# Patient Record
Sex: Male | Born: 1975 | Race: White | Hispanic: No | Marital: Single | Smoking: Never smoker
Health system: Southern US, Community
[De-identification: ages and names within clinical notes are randomized; demographics above are authoritative.]

## PROBLEM LIST (undated history)

## (undated) DIAGNOSIS — I10 Essential (primary) hypertension: Secondary | ICD-10-CM

---

## 2017-11-26 ENCOUNTER — Emergency Department
Admission: EM | Admit: 2017-11-26 | Discharge: 2017-11-26 | Disposition: A | Discharge status: Home / Self Care | Payer: BLUE CROSS/BLUE SHIELD | Attending: Emergency Medicine | Admitting: Emergency Medicine

## 2017-11-26 ENCOUNTER — Other Ambulatory Visit: Payer: Self-pay

## 2017-11-26 DIAGNOSIS — R002 Palpitations: Secondary | ICD-10-CM | POA: Diagnosis not present

## 2017-11-26 DIAGNOSIS — Z79899 Other long term (current) drug therapy: Secondary | ICD-10-CM | POA: Insufficient documentation

## 2017-11-26 LAB — COMPREHENSIVE METABOLIC PANEL
ALBUMIN: 4.7 g/dL (ref 3.5–5.0)
ALK PHOS: 63 U/L (ref 38–126)
ALT: 27 U/L (ref 17–63)
ANION GAP: 9 (ref 5–15)
AST: 30 U/L (ref 15–41)
BILIRUBIN TOTAL: 1.1 mg/dL (ref 0.3–1.2)
BUN: 14 mg/dL (ref 6–20)
CALCIUM: 9.5 mg/dL (ref 8.9–10.3)
CO2: 25 mmol/L (ref 22–32)
Chloride: 103 mmol/L (ref 101–111)
Creatinine, Ser: 1.22 mg/dL (ref 0.61–1.24)
GFR calc Af Amer: >60 mL/min (ref >60)
GFR calc non Af Amer: >60 mL/min (ref >60)
GLUCOSE: 125 mg/dL — AB (ref 65–99)
Potassium: 3.5 mmol/L (ref 3.5–5.1)
SODIUM: 137 mmol/L (ref 135–145)
Total Protein: 8.1 g/dL (ref 6.5–8.1)

## 2017-11-26 LAB — CBC
HEMATOCRIT: 46 % (ref 40.0–52.0)
HEMOGLOBIN: 15.8 g/dL (ref 13.0–18.0)
MCH: 31.6 pg (ref 26.0–34.0)
MCHC: 34.4 g/dL (ref 32.0–36.0)
MCV: 91.9 fL (ref 80.0–100.0)
Platelets: 299 10*3/uL (ref 150–440)
RBC: 5 MIL/uL (ref 4.40–5.90)
RDW: 12.5 % (ref 11.5–14.5)
WBC: 6.1 10*3/uL (ref 3.8–10.6)

## 2017-11-26 LAB — TROPONIN I: Troponin I: <0.03 ng/mL (ref <0.03)

## 2017-11-26 NOTE — ED Notes (Signed)
Pt alert and oriented X4, active, cooperative, pt in NAD. RR even and unlabored, color WNL.  Pt informed to return if any life threatening symptoms occur.  Discharge and followup instructions reviewed.  

## 2017-11-26 NOTE — ED Triage Notes (Signed)
Feeling of palpitations began 7 am. Denies chest pain. History of same.

## 2017-11-26 NOTE — ED Provider Notes (Signed)
[facility] Emergency Department Provider Note  ____________________________________________   First MD Initiated Contact with Patient 11/26/17 [phone]     (approximate)  I have reviewed the triage vital signs and the nursing notes.   HISTORY  Chief Complaint Palpitations   HPI Tyrone Reyes is a 42 y.o. male with a history of heart palpitations on metoprolol 50 mg twice daily who is presenting to the emergency department today complaining of feeling his heart racing since about 7:15 AM.  He says that it is associated with lightheadedness and radiation of pain down his bilateral arms.  He says that he has had similar symptoms in the past and is seen a cardiologist, Dr. Harl Bowie.  He says that he has had a stress test as well as an echocardiogram but was not able to get the Holter monitor testing.  He says that he has similar symptoms every few weeks but they have been prolonged this morning which is why he presented to the emergency department for further evaluation.  He denies any history of heart disease in his family.  Denies any smoking.  Drinks occasionally and does not use any drugs.  No past medical history on file.  There are no active problems to display for this patient.     Prior to Admission medications   Medication Sig Start Date End Date Taking? Authorizing Provider  metoprolol tartrate (LOPRESSOR) 50 MG tablet Take 50 mg by mouth 2 (two) times daily. 10/29/17  Yes [provider]    Allergies Patient has no known allergies.  No family history on file.  Social History Social History   Tobacco Use  . Smoking status: Not on file  Substance Use Topics  . Alcohol use: Not on file  . Drug use: Not on file    Review of Systems  Constitutional: No fever/chills Eyes: No visual changes. ENT: No sore throat. Cardiovascular: As above Respiratory: Denies shortness of breath. Gastrointestinal: No abdominal pain.  No nausea, no  vomiting.  No diarrhea.  No constipation. Genitourinary: Negative for dysuria. Musculoskeletal: Negative for back pain. Skin: Negative for rash. Neurological: Negative for headaches, focal weakness or numbness.   ____________________________________________   PHYSICAL EXAM:  VITAL SIGNS: ED Triage Vitals  Enc Vitals Group     BP 11/26/17 0854 116/78     Pulse Rate 11/26/17 0854 76     Resp 11/26/17 0854 20     Temp 11/26/17 0854 98 F (36.7 C)     Temp Source 11/26/17 0854 Oral     SpO2 11/26/17 0854 97 %     Weight 11/26/17 0855 220 lb (99.8 kg)     Height 11/26/17 0855  (1.753 m)     Head Circumference --      Peak Flow --      Pain Score 11/26/17 0855 0     Pain Loc --      Pain Edu? --      Excl. in [place]? --     Constitutional: Alert and oriented. Well appearing and in no acute distress. Eyes: Conjunctivae are normal.  Head: Atraumatic. Nose: No congestion/rhinnorhea. Mouth/Throat: Mucous membranes are moist.  Neck: No stridor.   Cardiovascular: Normal rate, regular rhythm. Grossly normal heart sounds.  Good peripheral circulation with equal and bilateral radial pulses. Respiratory: Normal respiratory effort.  No retractions. Lungs CTAB. Gastrointestinal: Soft and nontender. No distention.  Musculoskeletal: No lower extremity tenderness nor edema.  No joint effusions. Neurologic:  Normal speech and  language. No gross focal neurologic deficits are appreciated. Skin:  Skin is warm, dry and intact. No rash noted. Psychiatric: Mood and affect are normal. Speech and behavior are normal.  ____________________________________________   LABS (all labs ordered are listed, but only abnormal results are displayed)  Labs Reviewed  COMPREHENSIVE METABOLIC PANEL - Abnormal; Notable for the following components:      Result Value   Glucose, Bld 125 (*)    All other components within normal limits  CBC  TROPONIN I    ____________________________________________  EKG  ED ECG REPORT I, Arelia Longest, the attending physician, personally viewed and interpreted this ECG.   Date: 11/26/2017  EKG Time: 0859  Rate: 80  Rhythm: normal sinus rhythm  Axis: Normal  Intervals:none  ST&T Change: No ST segment elevation or depression.  No abnormal T wave inversion.  ____________________________________________  RADIOLOGY   ____________________________________________   PROCEDURES  Procedure(s) performed:   Procedures  Critical Care performed:   ____________________________________________   INITIAL IMPRESSION / ASSESSMENT AND PLAN / ED COURSE  Pertinent labs & imaging results that were available during my care of the patient were reviewed by me and considered in my medical decision making (see chart for details).  DDX: Palpitations, atrial fibrillation, WPW, atrial flutter, ventricular tachycardia, near-syncope As part of my medical decision making, I reviewed the following data within the electronic MEDICAL RECORD NUMBERNo previous records on file for review.  ----------------------------------------- 10:22 AM on 11/26/2017 -----------------------------------------  Patient at this time says he feels improved.  No longer feeling palpitations.  No pain to the arms.  Says that he has had symptoms like this in the past.  Very reassuring work-up here with normal lab testing as well as EKG.  States that he has had provocative testing at Dr. Renie Ora office that was negative.  However did not have a Holter monitor.  Recommend that he call Dr. Harl Bowie for follow-up.  Blood pressure in the 1 teens today.  I will not be increasing his metoprolol.  The patient will continue on metoprolol and follow-up as an outpatient with Dr. Harl Bowie.  He is understanding of this plan willing to comply. ____________________________________________   FINAL CLINICAL IMPRESSION(S) / ED  DIAGNOSES  Palpitations.    NEW MEDICATIONS STARTED DURING THIS VISIT:  New Prescriptions   No medications on file     Note:  This document was prepared using Dragon voice recognition software and may include unintentional dictation errors.     Myrna Blazer, MD 11/26/17 1023

## 2019-05-01 ENCOUNTER — Other Ambulatory Visit: Payer: Self-pay | Admitting: Cardiology

## 2019-05-01 ENCOUNTER — Ambulatory Visit
Admission: RE | Admit: 2019-05-01 | Discharge: 2019-05-01 | Disposition: A | Discharge status: Home / Self Care | Payer: BC Managed Care – PPO | Attending: Internal Medicine | Admitting: Internal Medicine

## 2019-05-01 ENCOUNTER — Other Ambulatory Visit: Payer: Self-pay

## 2019-05-01 ENCOUNTER — Ambulatory Visit
Admission: RE | Admit: 2019-05-01 | Discharge: 2019-05-01 | Disposition: A | Discharge status: Home / Self Care | Payer: BC Managed Care – PPO | Source: Ambulatory Visit | Attending: Cardiology | Admitting: Cardiology

## 2019-05-01 DIAGNOSIS — R202 Paresthesia of skin: Secondary | ICD-10-CM | POA: Insufficient documentation

## 2019-05-01 DIAGNOSIS — R200 Anesthesia of skin: Secondary | ICD-10-CM

## 2019-09-19 ENCOUNTER — Ambulatory Visit: Payer: BC Managed Care – PPO | Attending: Internal Medicine

## 2019-09-19 ENCOUNTER — Other Ambulatory Visit: Payer: Self-pay

## 2019-09-19 DIAGNOSIS — Z23 Encounter for immunization: Secondary | ICD-10-CM

## 2019-09-19 NOTE — Progress Notes (Signed)
   Covid-19 Vaccination Clinic  Name:  Tyrone Reyes    MRN: [id number] DOB: September 29, 1975  09/19/2019  [name] was observed post Covid-19 immunization for 15 minutes without incident. He was provided with Vaccine Information Sheet and instruction to access the V-Safe system.   [name] was instructed to call 911 with any severe reactions post vaccine: Marland Kitchen Difficulty breathing  . Swelling of face and throat  . A fast heartbeat  . A bad rash all over body  . Dizziness and weakness   Immunizations Administered    Name Date Dose VIS Date Route   Pfizer COVID-19 Vaccine 09/19/2019  8:11 AM 0.3 mL 06/20/2019 Intramuscular   Manufacturer: [employer], [employer]   Lot: [id number]   NDC: [address]-5

## 2019-10-14 ENCOUNTER — Ambulatory Visit: Payer: BC Managed Care – PPO | Attending: Internal Medicine

## 2019-10-14 DIAGNOSIS — Z23 Encounter for immunization: Secondary | ICD-10-CM

## 2019-10-14 NOTE — Progress Notes (Signed)
   Covid-19 Vaccination Clinic  Name:  Khasir Woodrome    MRN: [id number] DOB: 1975-09-12  10/14/2019  Mr. Limas was observed post Covid-19 immunization for 15 minutes without incident. He was provided with Vaccine Information Sheet and instruction to access the V-Safe system.   Mr. Burgoon was instructed to call 911 with any severe reactions post vaccine: Marland Kitchen Difficulty breathing  . Swelling of face and throat  . A fast heartbeat  . A bad rash all over body  . Dizziness and weakness   Immunizations Administered    Name Date Dose VIS Date Route   Pfizer COVID-19 Vaccine 10/14/2019  8:28 AM 0.3 mL 06/20/2019 Intramuscular   Manufacturer: [employer], [employer]   Lot: [phone] [id number]   NDC: [address]-1

## 2020-07-16 ENCOUNTER — Other Ambulatory Visit: Payer: Self-pay | Admitting: Internal Medicine

## 2021-04-03 IMAGING — CR DG CERVICAL SPINE COMPLETE 4+V
1 series · 6 of 6 positions shown · non-contrast
Comparison: No prior.

CLINICAL DATA: Bilateral arm and finger numbness.  No known injury.

EXAM:
CERVICAL SPINE - COMPLETE 4+ VIEW

[Series 1: dg cervical spine complete · 0.14mm/px · 6 of 6 slices shown]
[im 1/6]
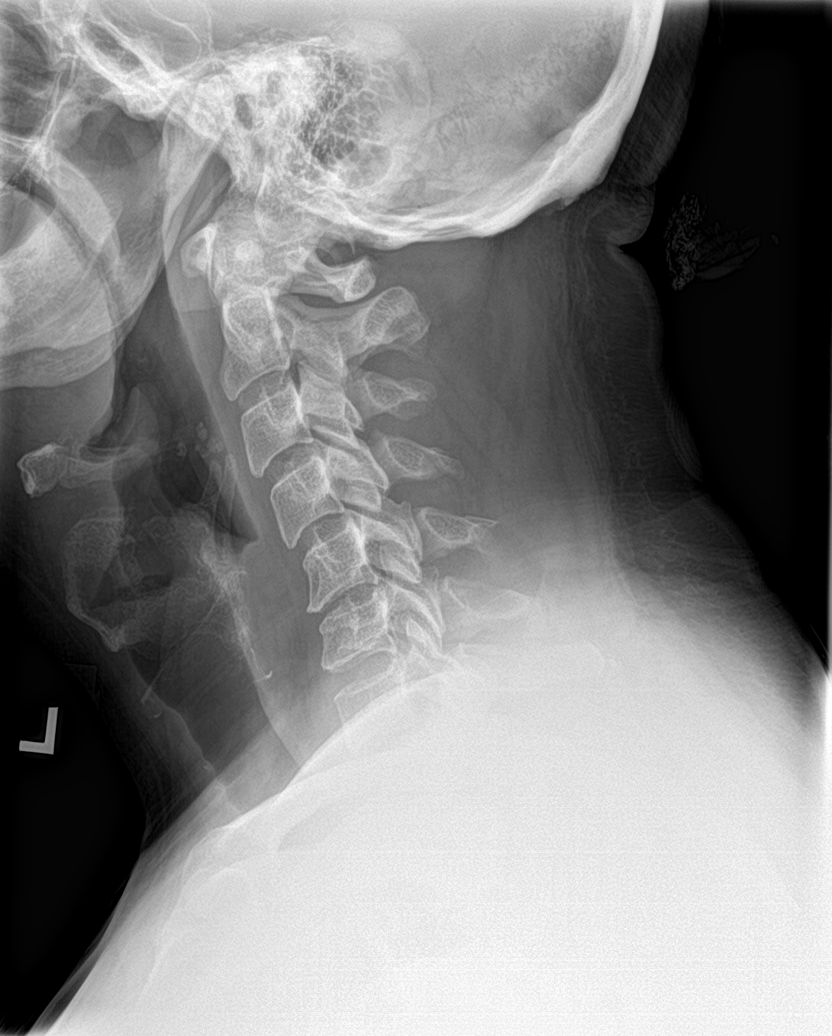
[im 2/6]
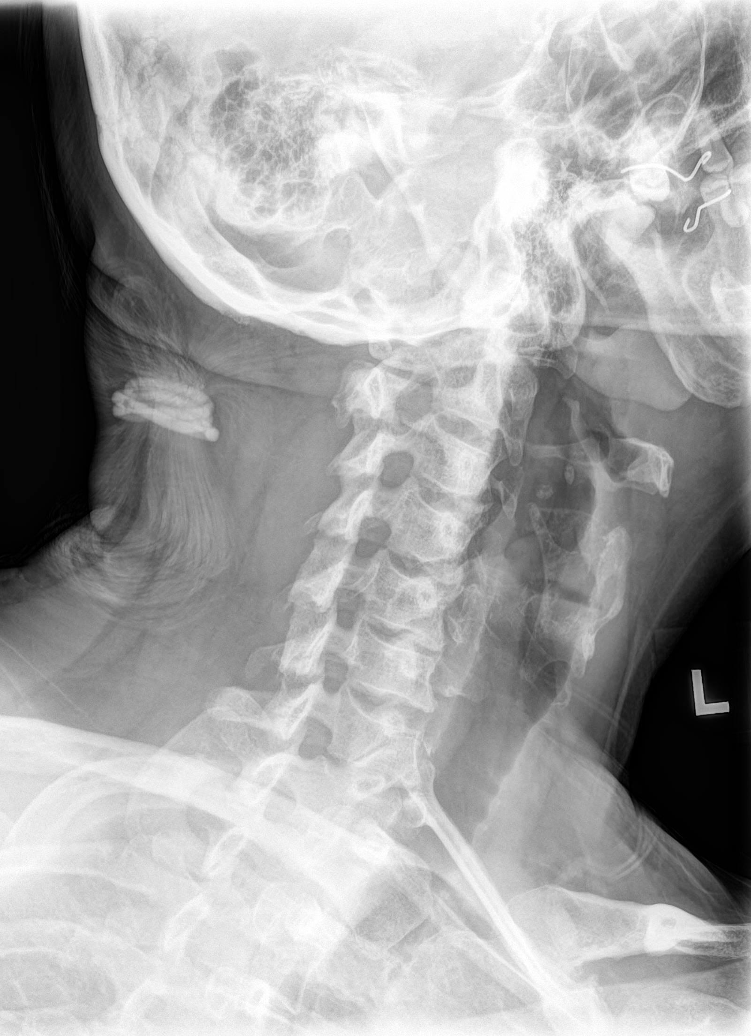
[im 3/6]
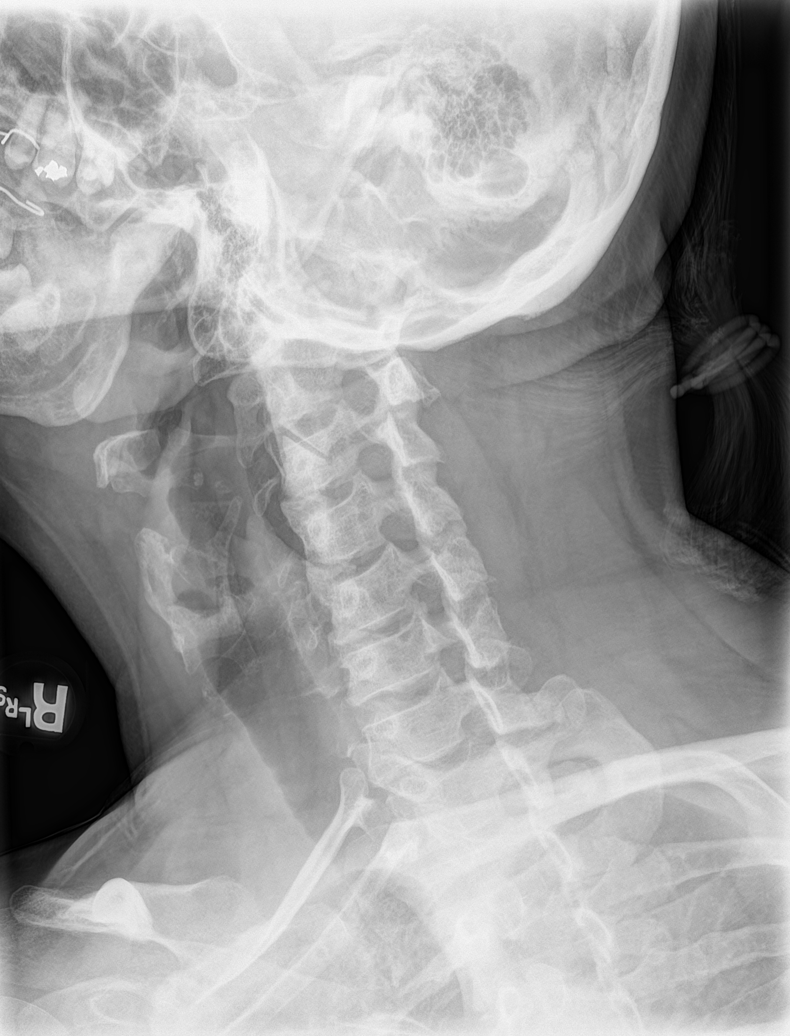
[im 4/6]
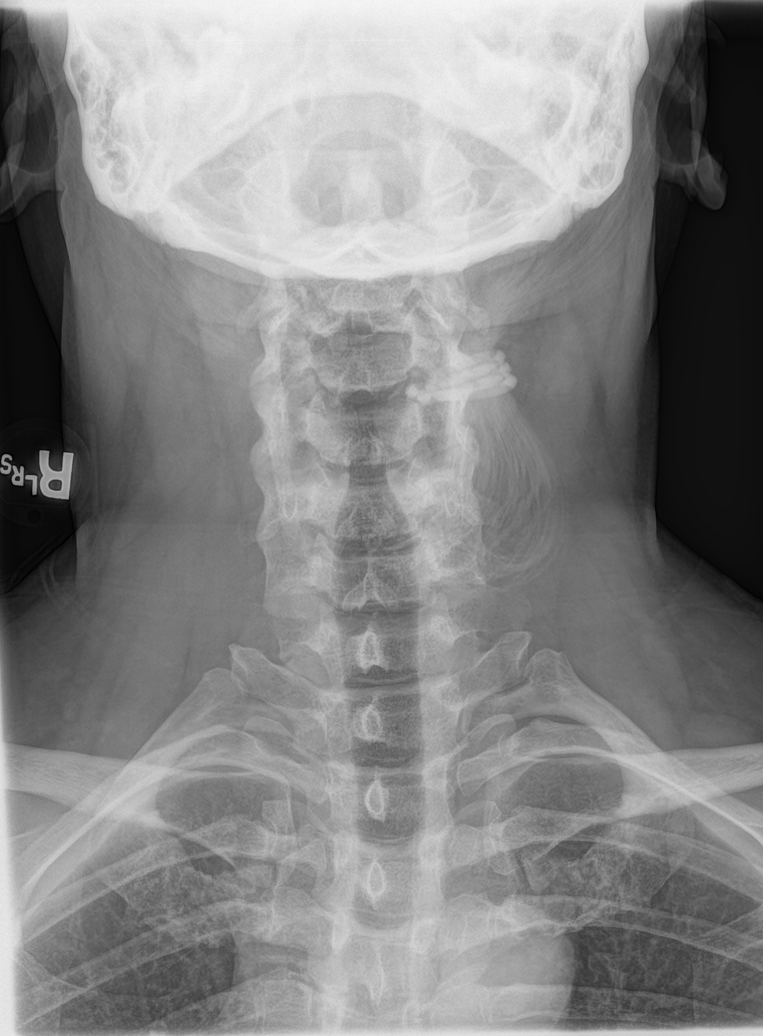
[im 5/6]
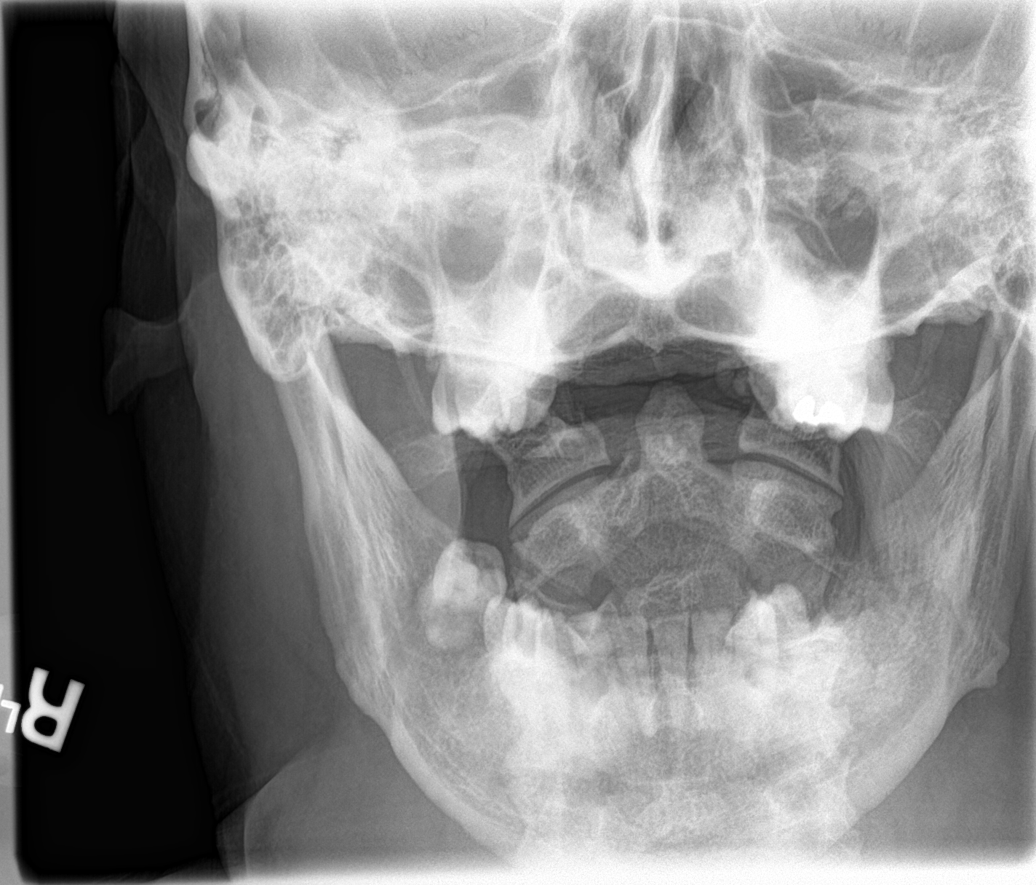
[im 6/6]
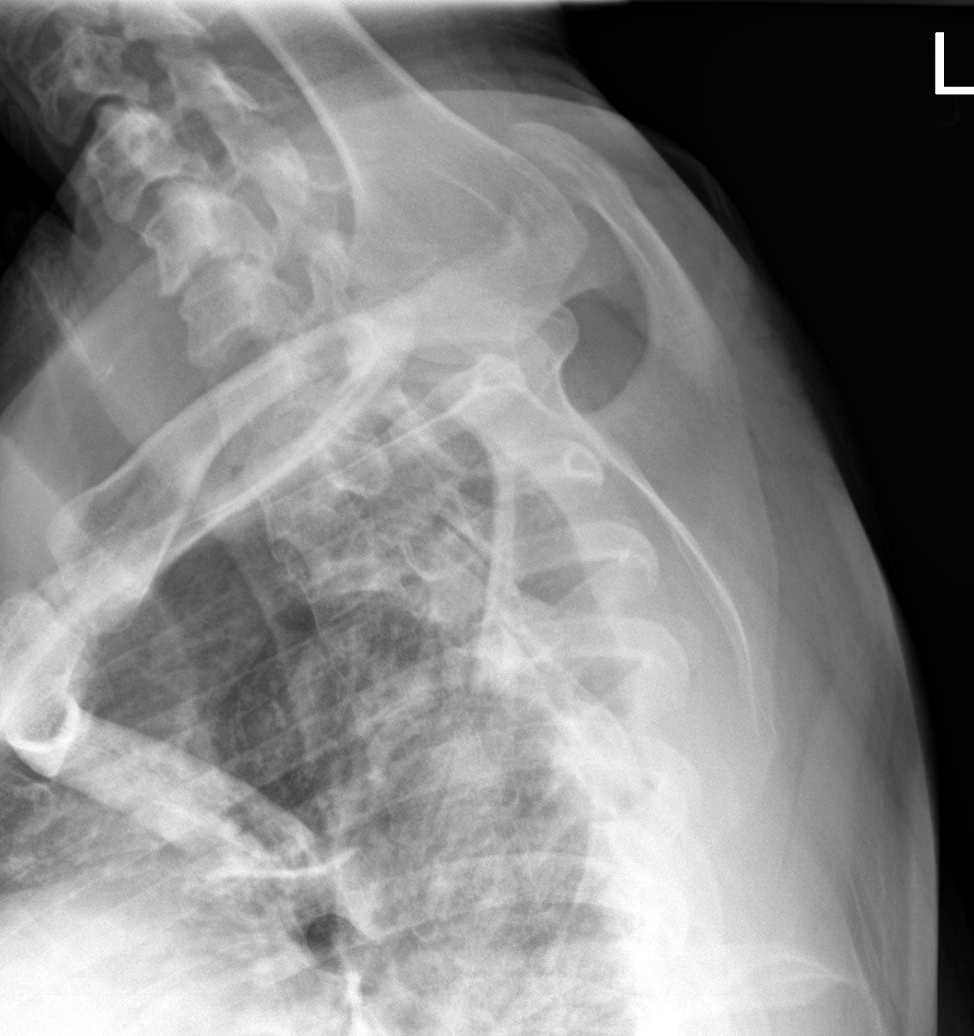

[6 of 6 positions shown; findings below may reference images not displayed]

FINDINGS: C5-C6 disc degeneration endplate osteophyte formation noted with
loss of normal cervical lordosis. Findings consistent DJD. No acute
bony abnormality identified. No evidence of fracture.
IMPRESSION: C5-C6 disc degeneration and endplate osteophyte formation noted
consistent with DJD. Loss of normal cervical lordosis. No acute
abnormality.

## 2022-07-29 ENCOUNTER — Emergency Department: Payer: BC Managed Care – PPO

## 2022-07-29 ENCOUNTER — Emergency Department
Admission: EM | Admit: 2022-07-29 | Discharge: 2022-07-29 | Disposition: A | Discharge status: Home / Self Care | Payer: BC Managed Care – PPO | Attending: Student in an Organized Health Care Education/Training Program | Admitting: Student in an Organized Health Care Education/Training Program

## 2022-07-29 ENCOUNTER — Other Ambulatory Visit: Payer: Self-pay

## 2022-07-29 ENCOUNTER — Encounter: Payer: Self-pay | Admitting: Emergency Medicine

## 2022-07-29 DIAGNOSIS — I10 Essential (primary) hypertension: Secondary | ICD-10-CM | POA: Diagnosis present

## 2022-07-29 HISTORY — DX: Essential (primary) hypertension: I10

## 2022-07-29 LAB — BASIC METABOLIC PANEL
Anion gap: 11 (ref 5–15)
BUN: 10 mg/dL (ref 6–20)
CO2: 22 mmol/L (ref 22–32)
Calcium: 8.9 mg/dL (ref 8.9–10.3)
Chloride: 104 mmol/L (ref 98–111)
Creatinine, Ser: 0.84 mg/dL (ref 0.61–1.24)
GFR, Estimated: >60 mL/min (ref >60)
Glucose, Bld: 139 mg/dL — ABNORMAL HIGH (ref 70–99)
Potassium: 3.4 mmol/L — ABNORMAL LOW (ref 3.5–5.1)
Sodium: 137 mmol/L (ref 135–145)

## 2022-07-29 LAB — CBC
HCT: 47.5 % (ref 39.0–52.0)
Hemoglobin: 16.6 g/dL (ref 13.0–17.0)
MCH: 32.4 pg (ref 26.0–34.0)
MCHC: 34.9 g/dL (ref 30.0–36.0)
MCV: 92.6 fL (ref 80.0–100.0)
Platelets: 287 10*3/uL (ref 150–400)
RBC: 5.13 MIL/uL (ref 4.22–5.81)
RDW: 11.9 % (ref 11.5–15.5)
WBC: 7.9 10*3/uL (ref 4.0–10.5)
nRBC: 0 % (ref 0.0–0.2)

## 2022-07-29 LAB — TROPONIN I (HIGH SENSITIVITY): Troponin I (High Sensitivity): 11 ng/L (ref <18)

## 2022-07-29 MED ORDER — METOPROLOL TARTRATE 50 MG PO TABS: 50.0000 mg | ORAL_TABLET | Freq: Two times a day (BID) | ORAL | 0 refills | Status: AC

## 2022-07-29 MED ORDER — LISINOPRIL 10 MG PO TABS: 10.0000 mg | ORAL_TABLET | Freq: Every day | ORAL | 11 refills | Status: AC

## 2022-07-29 MED ORDER — METOPROLOL TARTRATE 25 MG PO TABS
25.0000 mg | ORAL_TABLET | Freq: Once | ORAL | Status: AC
  Administered 2022-07-29: 25 mg via ORAL
  Filled 2022-07-29: qty 1

## 2022-07-29 MED ORDER — AMLODIPINE BESYLATE 5 MG PO TABS
5.0000 mg | ORAL_TABLET | Freq: Once | ORAL | Status: AC
  Administered 2022-07-29: 5 mg via ORAL
  Filled 2022-07-29: qty 1

## 2022-07-29 MED ORDER — LISINOPRIL 10 MG PO TABS
10.0000 mg | ORAL_TABLET | Freq: Once | ORAL | Status: AC
  Administered 2022-07-29: 10 mg via ORAL
  Filled 2022-07-29: qty 1

## 2022-07-29 MED ORDER — POTASSIUM CHLORIDE CRYS ER 20 MEQ PO TBCR
40.0000 meq | EXTENDED_RELEASE_TABLET | Freq: Once | ORAL | Status: AC
  Administered 2022-07-29: 40 meq via ORAL
  Filled 2022-07-29: qty 2

## 2022-07-29 NOTE — ED Provider Notes (Signed)
[facility] Provider Note    Event Date/Time   First MD Initiated Contact with Patient 07/29/22 1321     (approximate)   History   Hypertension   HPI  Tyrone Reyes is a 47 y.o. male with a history of not currently on antihypertensive medications presents to the ER for evaluation of headaches and blurry vision as well as intermittent chest pain over the past week.  Denies any inciting events.  No trauma.  No numbness or tingling.  Checked his blood pressure and it has been high in the 190s most of the week.  Denies any shortness of breath.  No leg pain.  No swelling.  No particular reason given for why he stopped taking blood pressure medications.     Physical Exam   Triage Vital Signs: ED Triage Vitals  Enc Vitals Group     BP 07/29/22 1214 (!) 213/131     Pulse Rate 07/29/22 1214 (!) 103     Resp 07/29/22 1214 20     Temp 07/29/22 1214 98.3 F (36.8 C)     Temp Source 07/29/22 1214 Oral     SpO2 07/29/22 1214 96 %     Weight 07/29/22 1216 265 lb (120.2 kg)     Height 07/29/22 1216 5\' 10"  (1.778 m)     Head Circumference --      Peak Flow --      Pain Score 07/29/22 1215 2     Pain Loc --      Pain Edu? --      Excl. in [place]? --     Most recent vital signs: Vitals:   07/29/22 1445 07/29/22 1453  BP:  (!) 187/120  Pulse: 73 88  Resp: 19 20  Temp:    SpO2: 98% 96%     Constitutional: Alert  Eyes: Conjunctivae are normal.  Head: Atraumatic. Nose: No congestion/rhinnorhea. Mouth/Throat: Mucous membranes are moist.   Neck: Painless ROM.  Cardiovascular:   Good peripheral circulation. Respiratory: Normal respiratory effort.  No retractions.  Gastrointestinal: Soft and nontender.  Musculoskeletal:  no deformity Neurologic:  MAE spontaneously. No gross focal neurologic deficits are appreciated.  Skin:  Skin is warm, dry and intact. No rash noted. Psychiatric: Mood and affect are normal. Speech and behavior are normal.    ED Results  / Procedures / Treatments   Labs (all labs ordered are listed, but only abnormal results are displayed) Labs Reviewed  BASIC METABOLIC PANEL - Abnormal; Notable for the following components:      Result Value   Potassium 3.4 (*)    Glucose, Bld 139 (*)    All other components within normal limits  CBC  TROPONIN I (HIGH SENSITIVITY)  TROPONIN I (HIGH SENSITIVITY)     EKG  ED ECG REPORT I, Merlyn Lot, the attending physician, personally viewed and interpreted this ECG.   Date: 07/29/2022  EKG Time: 12:19  Rate: 95  Rhythm: sinus  Axis: normal  Intervals: normal  ST&T Change: no stemi, no depression    RADIOLOGY Please see ED Course for my review and interpretation.  I personally reviewed all radiographic images ordered to evaluate for the above acute complaints and reviewed radiology reports and findings.  These findings were personally discussed with the patient.  Please see medical record for radiology report.    PROCEDURES:  Critical Care performed: No  Procedures   MEDICATIONS ORDERED IN ED: Medications  amLODipine (NORVASC) tablet 5 mg (5 mg Oral Given  07/29/22 1358)  metoprolol tartrate (LOPRESSOR) tablet 25 mg (25 mg Oral Given 07/29/22 1358)  lisinopril (ZESTRIL) tablet 10 mg (10 mg Oral Given 07/29/22 1505)  potassium chloride SA (KLOR-CON M) CR tablet 40 mEq (40 mEq Oral Given 07/29/22 1504)     IMPRESSION / MDM / ASSESSMENT AND PLAN / ED COURSE  I reviewed the triage vital signs and the nursing notes.                              Differential diagnosis includes, but is not limited to, Asthma, copd, CHF, pna, ptx, malignancy, Pe, anemia  Patient presenting to the ER for evaluation of symptoms as described above.  Based on symptoms, risk factors and considered above differential, this presenting complaint could reflect a potentially life-threatening illness therefore the patient will be placed on continuous pulse oximetry and telemetry for  monitoring.  Laboratory evaluation will be sent to evaluate for the above complaints.      Clinical Course as of 07/29/22 1521  Sat Jul 29, 2022  1520 Patient's x-ray on my review and interpretation without evidence of consolidation effusion or edema.  Does have borderline cardiomegaly.  Troponin negative.  Renal function normal.  Blood pressure is responding to oral antihypertensive medication.  CT imaging without evidence of acute abnormality.  Neuroexam is nonfocal and stable.  Patient does have uncontrolled hypertension noncompliant with his previously prescribed medications.  Will restart on his medications.  Discussed importance of close outpatient follow-up for further titration of his blood pressure medications as well as signs and symptoms for which she should return to the ER. [PR]    Clinical Course User Index [PR] Merlyn Lot, MD     FINAL CLINICAL IMPRESSION(S) / ED DIAGNOSES   Final diagnoses:  Uncontrolled hypertension     Rx / DC Orders   ED Discharge Orders          Ordered    metoprolol tartrate (LOPRESSOR) 50 MG tablet  2 times daily        07/29/22 1519    lisinopril (ZESTRIL) 10 MG tablet  Daily        07/29/22 1519             Note:  This document was prepared using Dragon voice recognition software and may include unintentional dictation errors.    Merlyn Lot, MD 07/29/22 [phone]

## 2022-07-29 NOTE — ED Triage Notes (Signed)
Pt via POV from home. Pt c/o HTN, states he used to take BP medication but has been off since 2021. Pt reports lightheadedness and headache intermittently. Pt is A&Ox4 and NAD
